# Patient Record
Sex: Male | Born: 1995 | Race: Black or African American | Hispanic: No | Marital: Single | Smoking: Never smoker
Health system: Southern US, Community
[De-identification: ages and names within clinical notes are randomized; demographics above are authoritative.]

---
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
## 2014-04-07 ENCOUNTER — Encounter (HOSPITAL_COMMUNITY): Payer: Self-pay | Admitting: *Deleted

## 2014-04-07 ENCOUNTER — Emergency Department (HOSPITAL_COMMUNITY)
Admission: EM | Admit: 2014-04-07 | Discharge: 2014-04-07 | Disposition: A | Discharge status: Home / Self Care | Payer: No Typology Code available for payment source | Attending: Emergency Medicine | Admitting: Emergency Medicine

## 2014-04-07 DIAGNOSIS — R590 Localized enlarged lymph nodes: Secondary | ICD-10-CM | POA: Insufficient documentation

## 2014-04-07 DIAGNOSIS — Z202 Contact with and (suspected) exposure to infections with a predominantly sexual mode of transmission: Secondary | ICD-10-CM | POA: Insufficient documentation

## 2014-04-07 MED ORDER — LIDOCAINE HCL (PF) 1 % IJ SOLN
INTRAMUSCULAR | Status: AC
  Filled 2014-04-07: qty 5

## 2014-04-07 MED ORDER — AZITHROMYCIN 250 MG PO TABS
1000.0000 mg | ORAL_TABLET | Freq: Once | ORAL | Status: AC
  Administered 2014-04-07: 1000 mg via ORAL
  Filled 2014-04-07: qty 4

## 2014-04-07 MED ORDER — CEFTRIAXONE SODIUM 250 MG IJ SOLR
250.0000 mg | Freq: Once | INTRAMUSCULAR | Status: AC
  Administered 2014-04-07: 250 mg via INTRAMUSCULAR
  Filled 2014-04-07: qty 250
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
## 2014-04-09 ENCOUNTER — Telehealth (HOSPITAL_COMMUNITY): Payer: Self-pay

## 2014-04-09 LAB — GC/CHLAMYDIA PROBE AMP (~~LOC~~) NOT AT ARMC
Chlamydia: NEGATIVE
Neisseria Gonorrhea: POSITIVE — AB

## 2014-04-09 LAB — RPR: RPR Ser Ql: NONREACTIVE

## 2014-04-09 LAB — HIV ANTIBODY (ROUTINE TESTING W REFLEX): HIV Screen 4th Generation wRfx: NONREACTIVE
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
## 2019-03-14 ENCOUNTER — Ambulatory Visit: Payer: Self-pay | Attending: Internal Medicine

## 2019-03-14 ENCOUNTER — Other Ambulatory Visit: Payer: Self-pay

## 2019-03-14 DIAGNOSIS — Z20822 Contact with and (suspected) exposure to covid-19: Secondary | ICD-10-CM | POA: Insufficient documentation

## 2019-03-15 LAB — NOVEL CORONAVIRUS, NAA: SARS-CoV-2, NAA: NOT DETECTED
